# Patient Record
Sex: Female | Born: 2000 | Race: White | Hispanic: No | Marital: Single | Smoking: Never smoker
Health system: Southern US, Community
[De-identification: ages and names within clinical notes are randomized; demographics above are authoritative.]

---
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
## 2019-09-30 ENCOUNTER — Ambulatory Visit (INDEPENDENT_AMBULATORY_CARE_PROVIDER_SITE_OTHER): Payer: Managed Care, Other (non HMO)

## 2019-09-30 ENCOUNTER — Other Ambulatory Visit: Payer: Self-pay

## 2019-09-30 ENCOUNTER — Ambulatory Visit (INDEPENDENT_AMBULATORY_CARE_PROVIDER_SITE_OTHER): Payer: Managed Care, Other (non HMO) | Admitting: Sports Medicine

## 2019-09-30 DIAGNOSIS — S83002S Unspecified subluxation of left patella, sequela: Secondary | ICD-10-CM | POA: Diagnosis not present

## 2019-09-30 MED ORDER — TRIAZOLAM 0.25 MG PO TABS: ORAL_TABLET | ORAL | 0 refills | Status: DC

## 2019-09-30 NOTE — Assessment & Plan Note (Signed)
History of bilateral, left worse than right patellar dislocations. She is post what sounds to be lateral release and Fulkerson slide on the left. This was 2 years ago and she continues to have significant discomfort, mechanical symptoms. X-rays today, MRI hopefully tomorrow morning at 8 AM. I had like to see her back after the MRI and we will likely consider an injection and additional physical therapy. I would also like her to touch base again with Dr. Griffin Basil. Triazolam for preprocedural anxiolysis.

## 2019-09-30 NOTE — Progress Notes (Signed)
Subjective:    CC: Left knee pain  HPI: This is a pleasant 18 year old female, she is post patellar subluxation/dislocation, she had operative intervention with Dr. Everardo Pacific, it sounds as though she likely had a lateral release, medial plication and Fulkerson slide.  Since then she is had severe pain over the anterior lateral patella, anytime she bends the knee.  She did have some physical therapy after surgery but unfortunately symptoms have persisted.  She did not contact Dr. Everardo Pacific for further evaluation.  I reviewed the past medical history, family history, social history, surgical history, and allergies today and no changes were needed.  Please see the problem list section below in epic for further details.  Past Medical History: No past medical history on file. Past Surgical History: No past surgical history on file. Social History: Social History   Socioeconomic History  . Marital status: Single    Spouse name: Not on file  . Number of children: Not on file  . Years of education: Not on file  . Highest education level: Not on file  Occupational History  . Not on file  Tobacco Use  . Smoking status: Never Smoker  . Smokeless tobacco: Never Used  Substance and Sexual Activity  . Alcohol use: No  . Drug use: Not on file  . Sexual activity: Not on file  Other Topics Concern  . Not on file  Social History Narrative  . Not on file   Social Determinants of Health   Financial Resource Strain:   . Difficulty of Paying Living Expenses: Not on file  Food Insecurity:   . Worried About Programme researcher, broadcasting/film/video in the Last Year: Not on file  . Ran Out of Food in the Last Year: Not on file  Transportation Needs:   . Lack of Transportation (Medical): Not on file  . Lack of Transportation (Non-Medical): Not on file  Physical Activity:   . Days of Exercise per Week: Not on file  . Minutes of Exercise per Session: Not on file  Stress:   . Feeling of Stress : Not on file  Social  Connections:   . Frequency of Communication with Friends and Family: Not on file  . Frequency of Social Gatherings with Friends and Family: Not on file  . Attends Religious Services: Not on file  . Active Member of Clubs or Organizations: Not on file  . Attends Banker Meetings: Not on file  . Marital Status: Not on file   Family History: No family history on file. Allergies: No Known Allergies Medications: See med rec.  Review of Systems: No fevers, chills, night sweats, weight loss, chest pain, or shortness of breath.   Objective:    General: Well Developed, well nourished, and in no acute distress.  Neuro: Alert and oriented x3, extra-ocular muscles intact, sensation grossly intact.  HEENT: Normocephalic, atraumatic, pupils equal round reactive to light, neck supple, no masses, no lymphadenopathy, thyroid nonpalpable.  Skin: Warm and dry, no rashes. Cardiac: Regular rate and rhythm, no murmurs rubs or gallops, no lower extremity edema.  Respiratory: Clear to auscultation bilaterally. Not using accessory muscles, speaking in full sentences. Left knee: Normal to inspection with no erythema or effusion or obvious bony abnormalities. Well-healed surgical scars. Palpation normal with no warmth or joint line tenderness or patellar tenderness or condyle tenderness. ROM normal in flexion and extension and lower leg rotation. Ligaments with solid consistent endpoints including ACL, PCL, LCL, MCL. Negative Mcmurray's and provocative meniscal tests. Positive  painful patellar compression, positive patellar apprehension sign. Patellar and quadriceps tendons unremarkable. Hamstring and quadriceps strength is normal.  Impression and Recommendations:    Patellar subluxation History of bilateral, left worse than right patellar dislocations. She is post what sounds to be lateral release and Fulkerson slide on the left. This was 2 years ago and she continues to have significant  discomfort, mechanical symptoms. X-rays today, MRI hopefully tomorrow morning at 8 AM. I had like to see her back after the MRI and we will likely consider an injection and additional physical therapy. I would also like her to touch base again with Dr. Griffin Basil. Triazolam for preprocedural anxiolysis.   ___________________________________________ Gwen Her. Dianah Field, M.D., ABFM., CAQSM. Primary Care and Sports Medicine [facility]  Adjunct Professor of [place] [employer] of Medicine

## 2019-10-01 ENCOUNTER — Ambulatory Visit (INDEPENDENT_AMBULATORY_CARE_PROVIDER_SITE_OTHER): Payer: Managed Care, Other (non HMO) | Admitting: Sports Medicine

## 2019-10-01 ENCOUNTER — Encounter: Payer: Self-pay | Admitting: Sports Medicine

## 2019-10-01 ENCOUNTER — Ambulatory Visit (INDEPENDENT_AMBULATORY_CARE_PROVIDER_SITE_OTHER): Payer: Managed Care, Other (non HMO)

## 2019-10-01 DIAGNOSIS — S83002S Unspecified subluxation of left patella, sequela: Secondary | ICD-10-CM

## 2019-10-01 NOTE — Assessment & Plan Note (Signed)
Bilateral patellar dislocations, she is post lateral release on the left, she did have some persistent pain, mostly patellofemoral in nature. MRI showed patellofemoral chondromalacia, pain was predominantly when sitting, squatting, and with knees bent. She did not respond to oral analgesics so today we injected her knee, I would like her to also touch base with Dr. Griffin Basil to see if anything else needs to be done. She will continue her patellofemoral rehabilitation.

## 2019-10-01 NOTE — Progress Notes (Signed)
Subjective:    CC: Follow-up  HPI: This is a pleasant 18 year old female, she is post lateral release on her knee, she had a patellar dislocation.  Continues to have pain anteriorly, worse when getting up from a seated position moderate, persistent, localized without radiation, no mechanical symptoms, no trauma.  I reviewed the past medical history, family history, social history, surgical history, and allergies today and no changes were needed.  Please see the problem list section below in epic for further details.  Past Medical History: No past medical history on file. Past Surgical History: No past surgical history on file. Social History: Social History   Socioeconomic History  . Marital status: Single    Spouse name: Not on file  . Number of children: Not on file  . Years of education: Not on file  . Highest education level: Not on file  Occupational History  . Not on file  Tobacco Use  . Smoking status: Never Smoker  . Smokeless tobacco: Never Used  Substance and Sexual Activity  . Alcohol use: No  . Drug use: Not on file  . Sexual activity: Not on file  Other Topics Concern  . Not on file  Social History Narrative  . Not on file   Social Determinants of Health   Financial Resource Strain:   . Difficulty of Paying Living Expenses: Not on file  Food Insecurity:   . Worried About Charity fundraiser in the Last Year: Not on file  . Ran Out of Food in the Last Year: Not on file  Transportation Needs:   . Lack of Transportation (Medical): Not on file  . Lack of Transportation (Non-Medical): Not on file  Physical Activity:   . Days of Exercise per Week: Not on file  . Minutes of Exercise per Session: Not on file  Stress:   . Feeling of Stress : Not on file  Social Connections:   . Frequency of Communication with Friends and Family: Not on file  . Frequency of Social Gatherings with Friends and Family: Not on file  . Attends Religious Services: Not on file  .  Active Member of Clubs or Organizations: Not on file  . Attends Archivist Meetings: Not on file  . Marital Status: Not on file   Family History: No family history on file. Allergies: No Known Allergies Medications: See med rec.  Review of Systems: No fevers, chills, night sweats, weight loss, chest pain, or shortness of breath.   Objective:    General: Well Developed, well nourished, and in no acute distress.  Neuro: Alert and oriented x3, extra-ocular muscles intact, sensation grossly intact.  HEENT: Normocephalic, atraumatic, pupils equal round reactive to light, neck supple, no masses, no lymphadenopathy, thyroid nonpalpable.  Skin: Warm and dry, no rashes. Cardiac: Regular rate and rhythm, no murmurs rubs or gallops, no lower extremity edema.  Respiratory: Clear to auscultation bilaterally. Not using accessory muscles, speaking in full sentences.  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  Procedure: Real-time Ultrasound Guided injection of the left knee Device: Samsung HS60  Verbal informed consent obtained.  Time-out conducted.  Noted no overlying erythema, induration, or other signs of local infection.  Skin prepped in a sterile fashion.  Local anesthesia: Topical Ethyl chloride.  With sterile technique and under real time ultrasound guidance: 1 cc Kenalog 40, 2 cc lidocaine, 2 cc bupivacaine injected easily Completed without difficulty  Pain immediately resolved suggesting accurate  placement of the medication.  Advised to call if fevers/chills, erythema, induration, drainage, or persistent bleeding.  Images permanently stored and available for review in the ultrasound unit.  Impression: Technically successful ultrasound guided injection.  Impression and Recommendations:    Patellar subluxation Bilateral patellar dislocations, she is post lateral release on the left, she  did have some persistent pain, mostly patellofemoral in nature. MRI showed patellofemoral chondromalacia, pain was predominantly when sitting, squatting, and with knees bent. She did not respond to oral analgesics so today we injected her knee, I would like her to also touch base with Dr. Everardo Pacific to see if anything else needs to be done. She will continue her patellofemoral rehabilitation.   ___________________________________________ Ihor Austin. Benjamin Stain, M.D., ABFM., CAQSM. Primary Care and Sports Medicine [facility]  Adjunct Professor of Family Medicine  [employer]

## 2019-10-29 ENCOUNTER — Ambulatory Visit (INDEPENDENT_AMBULATORY_CARE_PROVIDER_SITE_OTHER): Payer: Managed Care, Other (non HMO) | Admitting: Sports Medicine

## 2019-10-29 ENCOUNTER — Other Ambulatory Visit: Payer: Self-pay

## 2019-10-29 DIAGNOSIS — S83002S Unspecified subluxation of left patella, sequela: Secondary | ICD-10-CM

## 2019-10-29 NOTE — Progress Notes (Signed)
    Procedures performed today:    None.  Independent interpretation of tests performed by another provider:   None.  Impression and Recommendations:    Patellar subluxation This pleasant 19 year old female returns, she has had bilateral patellar dislocations, and recurrent subluxations, she is post lateral release on the left. Unfortunately she continues to have persistent pain, MRI ultimately showed significant patellofemoral chondromalacia, with symptoms consistent with this finding. At the last visit I injected her knee and she is a lot better now. We also had her touch base with Dr. Everardo Pacific who had recommended continued nonoperative intervention. She can do Tylenol as needed, rehab exercises, she will also look into viscosupplementation, I am happy to do this bilaterally if needed. Return as needed.    ___________________________________________ Ihor Austin. Benjamin Stain, M.D., ABFM., CAQSM. Primary Care and Sports Medicine  [facility]  Adjunct Instructor of Family Medicine  [employer]

## 2019-10-29 NOTE — Assessment & Plan Note (Signed)
This pleasant 19 year old female returns, she has had bilateral patellar dislocations, and recurrent subluxations, she is post lateral release on the left. Unfortunately she continues to have persistent pain, MRI ultimately showed significant patellofemoral chondromalacia, with symptoms consistent with this finding. At the last visit I injected her knee and she is a lot better now. We also had her touch base with Dr. Everardo Pacific who had recommended continued nonoperative intervention. She can do Tylenol as needed, rehab exercises, she will also look into viscosupplementation, I am happy to do this bilaterally if needed. Return as needed.

## 2020-04-02 ENCOUNTER — Ambulatory Visit (INDEPENDENT_AMBULATORY_CARE_PROVIDER_SITE_OTHER): Payer: Managed Care, Other (non HMO) | Admitting: Physical Therapy

## 2020-04-02 ENCOUNTER — Other Ambulatory Visit: Payer: Self-pay

## 2020-04-02 ENCOUNTER — Encounter: Payer: Self-pay | Admitting: Physical Therapy

## 2020-04-02 DIAGNOSIS — M25661 Stiffness of right knee, not elsewhere classified: Secondary | ICD-10-CM

## 2020-04-02 DIAGNOSIS — M6281 Muscle weakness (generalized): Secondary | ICD-10-CM

## 2020-04-02 DIAGNOSIS — R600 Localized edema: Secondary | ICD-10-CM | POA: Diagnosis not present

## 2020-04-02 NOTE — Therapy (Signed)
[facility] [place] [address] [place], [place], [address] Phone: [phone]   Fax:  [phone]  Physical Therapy Evaluation  Patient Details  Name: Kendra Christensen MRN: [id number] Date of Birth: 09/12/2001 Referring Provider (PT): Ramond Marrow MD   Encounter Date: 04/02/2020   PT End of Session - 04/02/20 1148    Visit Number 1    Number of Visits 12    Date for PT Re-Evaluation 05/14/20    Authorization Type Cigna    PT Start Time 1149    PT Stop Time 1233    PT Time Calculation (min) 44 min    Equipment Utilized During Treatment Right knee immobilizer    Activity Tolerance Patient tolerated treatment well    Behavior During Therapy [facility] for tasks assessed/performed           History reviewed. No pertinent past medical history.  History reviewed. No pertinent surgical history.  There were no vitals filed for this visit.    Subjective Assessment - 04/02/20 1149    Subjective Patient had Rt arthroscopic chondroplasty, MPFL reconstruction with semitendinosus allograft, lateral release and tibial tubercle ant medialization 03/23/20. This surgery was proactive to prevent disclocation. She had the same surgery on the left knee in 2018. Patient is NWB in knee immobilizer and uses bil crutches. She reports that she scoots up/down stairs on her bottom due to not trusting her left knee. She denies pain.    Pertinent History Lt MPFL reconstruction 09/18/17    Limitations Walking    Patient Stated Goals Get back to full motion; be able to run eventually    Currently in Pain? No/denies              [facility] PT Assessment - 04/02/20 0001      Assessment   Referring Provider (PT) Ramond Marrow MD    Onset Date/Surgical Date 03/23/20    Next MD Visit August    Prior Therapy yes      Precautions   Precautions Knee    Precaution Comments brace locked in ext until 04/06/20, then unlocked at all times except sleeping.        Restrictions   Weight Bearing Restrictions Yes    RLE Weight Bearing Touchdown weight bearing    Other Position/Activity Restrictions heel touch 20% with brace locked in ext  for 1st 2 weeks, then unlocked brace.      Balance Screen   Has the patient fallen in the past 6 months No    Has the patient had a decrease in activity level because of a fear of falling?  No    Is the patient reluctant to leave their home because of a fear of falling?  No      Home Nurse, mental health Private residence    Living Arrangements Parent    Additional Comments stairs; uses seated technique due to left knee issues      Prior Function   Level of Independence Independent    Warden/ranger    Vocation Requirements leaves for [place] Aug 12th    Leisure running      Observation/Other Assessments   Focus on Therapeutic Outcomes (FOTO)  74% limited      Observation/Other Assessments-Edema    Edema Circumferential      Circumferential Edema   Circumferential - Right 49cm    Circumferential - Left  48 cm      ROM / Strength   AROM / PROM /  Strength PROM;AROM      AROM   AROM Assessment Site Knee    Right/Left Knee Right    Right Knee Flexion 30      PROM   PROM Assessment Site Knee    Right/Left Knee Right    Right Knee Extension 0    Right Knee Flexion 30      Palpation   Patella mobility WFL    Palpation comment tender around incisions      Ambulation/Gait   Pre-Gait Activities we used scale to determine 20% WB for pt    Gait Comments NWB on bil crutches WNL                      Objective measurements completed on examination: See above findings.               PT Education - 04/02/20 1254    Education Details HEP; heel touch WB at 20#    Person(s) Educated Patient    Methods Explanation;Demonstration;Handout    Comprehension Verbalized understanding;Returned demonstration            PT Short Term Goals - 04/02/20 1305      PT SHORT TERM  GOAL #1   Title Ind with initial HEP    Time 2    Period Weeks    Status New    Target Date 04/16/20             PT Long Term Goals - 04/02/20 1306      PT LONG TERM GOAL #1   Title Pt to demo right knee flexion = or > than left knee flex.    Time 6    Period Weeks    Status New      PT LONG TERM GOAL #2   Title Patient ind with HEP within protocol limits for strenghening.    Time 6    Period Weeks    Status New      PT LONG TERM GOAL #3   Title Patient able to safely ascend/descend curb and stairs in clinic using Bil crutches.    Time 6    Period Weeks    Status New                  Plan - 04/02/20 1254    Clinical Impression Statement Patient presents s/p right knee surgery (arthroscopic chondroplasty, MPFL reconstruction with semitendinosus allograft, lateral release and tibial tubercle ant medialization) on  03/23/20. She presents NWB on bil crutches in a Rt. knee immobilizer. She denies pain at this time. She has limited knee flexion to 30 deg and full knee ext. Incisions still have Steri strips and are healing well. She has edema in the right knee and reports regular icing at home. She has stairs at home, but scoots on her bottom up/down due to lack of confidence in her left knee which has also had the same surgery. She will benefit from PT to gain full ROM and strength and return to her PLOF. She returns to school in [place] on 05/14/20 and will continue PT there. Goals here are set within protocol limits.    Examination-Activity Limitations Transfers;Locomotion Level;Stairs    Examination-Participation Restrictions School;Driving    Stability/Clinical Decision Making Stable/Uncomplicated    Clinical Decision Making Low    Rehab Potential Excellent    PT Frequency 2x / week    PT Duration 6 weeks    PT Treatment/Interventions ADLs/Self Care Home Management;Electrical  Stimulation;Cryotherapy;Neuromuscular re-education;Therapeutic exercise;Gait training;Stair  training;Patient/family education;Manual techniques;Passive range of motion;Vasopneumatic Device;Taping    PT Next Visit Plan PROM to tolerance; progress active knee flex as tol, but avoid active knee extension; Strength: gentle quad sets, co-contractions, isometric quad/HS in extension and at knee flex > 60 deg.    PT Home Exercise Plan [id number] (quad sets and prone AA knee flex)    Consulted and Agree with Plan of Care Patient           Patient will benefit from skilled therapeutic intervention in order to improve the following deficits and impairments:  Difficulty walking, Decreased range of motion, Decreased strength, Increased edema  Visit Diagnosis: Stiffness of right knee, not elsewhere classified - Plan: PT plan of care cert/re-cert  Muscle weakness (generalized) - Plan: PT plan of care cert/re-cert  Localized edema - Plan: PT plan of care cert/re-cert     Problem List Patient Active Problem List   Diagnosis Date Noted  . Patellar subluxation 09/12/2016    Solon Palm PT 04/02/2020, 1:14 PM  [facility] [address] [place], [place], [address] Phone: [phone]   Fax:  [phone]  Name: Melina Mosteller MRN: [id number] Date of Birth: 02-13-2001

## 2020-04-02 NOTE — Patient Instructions (Signed)
Access Code: [email or web address] URL: [email or web address] Date: 04/02/2020 Prepared by: Raynelle Fanning  Exercises Supine Quadricep Sets - 3 x daily - 7 x weekly - 3 sets - 15 reps - 5 second hold Prone Knee Flexion AAROM - 2 x daily - 7 x weekly - 3 sets - 10 reps

## 2020-04-07 ENCOUNTER — Ambulatory Visit (INDEPENDENT_AMBULATORY_CARE_PROVIDER_SITE_OTHER): Payer: Managed Care, Other (non HMO) | Admitting: Rehabilitative and Restorative Service Providers"

## 2020-04-07 ENCOUNTER — Encounter: Payer: Self-pay | Admitting: Rehabilitative and Restorative Service Providers"

## 2020-04-07 ENCOUNTER — Other Ambulatory Visit: Payer: Self-pay

## 2020-04-07 DIAGNOSIS — M6281 Muscle weakness (generalized): Secondary | ICD-10-CM

## 2020-04-07 DIAGNOSIS — M25661 Stiffness of right knee, not elsewhere classified: Secondary | ICD-10-CM

## 2020-04-07 DIAGNOSIS — R600 Localized edema: Secondary | ICD-10-CM
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
## 2020-04-09 ENCOUNTER — Encounter: Payer: Managed Care, Other (non HMO) | Admitting: Physical Therapy

## 2020-04-13 ENCOUNTER — Encounter: Payer: Managed Care, Other (non HMO) | Admitting: Physical Therapy

## 2020-04-15 ENCOUNTER — Encounter: Payer: Managed Care, Other (non HMO) | Admitting: Physical Therapy

## 2020-04-20 ENCOUNTER — Encounter: Payer: Managed Care, Other (non HMO) | Admitting: Physical Therapy

## 2020-04-23 ENCOUNTER — Encounter: Payer: Managed Care, Other (non HMO) | Admitting: Physical Therapy

## 2020-04-27 ENCOUNTER — Encounter: Payer: Managed Care, Other (non HMO) | Admitting: Physical Therapy

## 2020-04-30 ENCOUNTER — Encounter: Payer: Managed Care, Other (non HMO) | Admitting: Physical Therapy

## 2020-05-04 ENCOUNTER — Encounter: Payer: Managed Care, Other (non HMO) | Admitting: Rehabilitative and Restorative Service Providers"

## 2020-05-07 ENCOUNTER — Encounter: Payer: Managed Care, Other (non HMO) | Admitting: Physical Therapy

## 2020-05-11 ENCOUNTER — Encounter: Payer: Managed Care, Other (non HMO) | Admitting: Physical Therapy

## 2020-05-13 ENCOUNTER — Encounter: Payer: Managed Care, Other (non HMO) | Admitting: Physical Therapy

## 2020-09-04 IMAGING — DX DG KNEE COMPLETE 4+V*L*
4 series · 4 of 4 positions shown · non-contrast
Comparison: 08/10/2017

CLINICAL DATA: Stabbing pain in left anterolateral knee since
surgery 2 years ago. Surgery for subluxation of patella.

EXAM:
LEFT KNEE - COMPLETE 4+ VIEW

[tunnel]
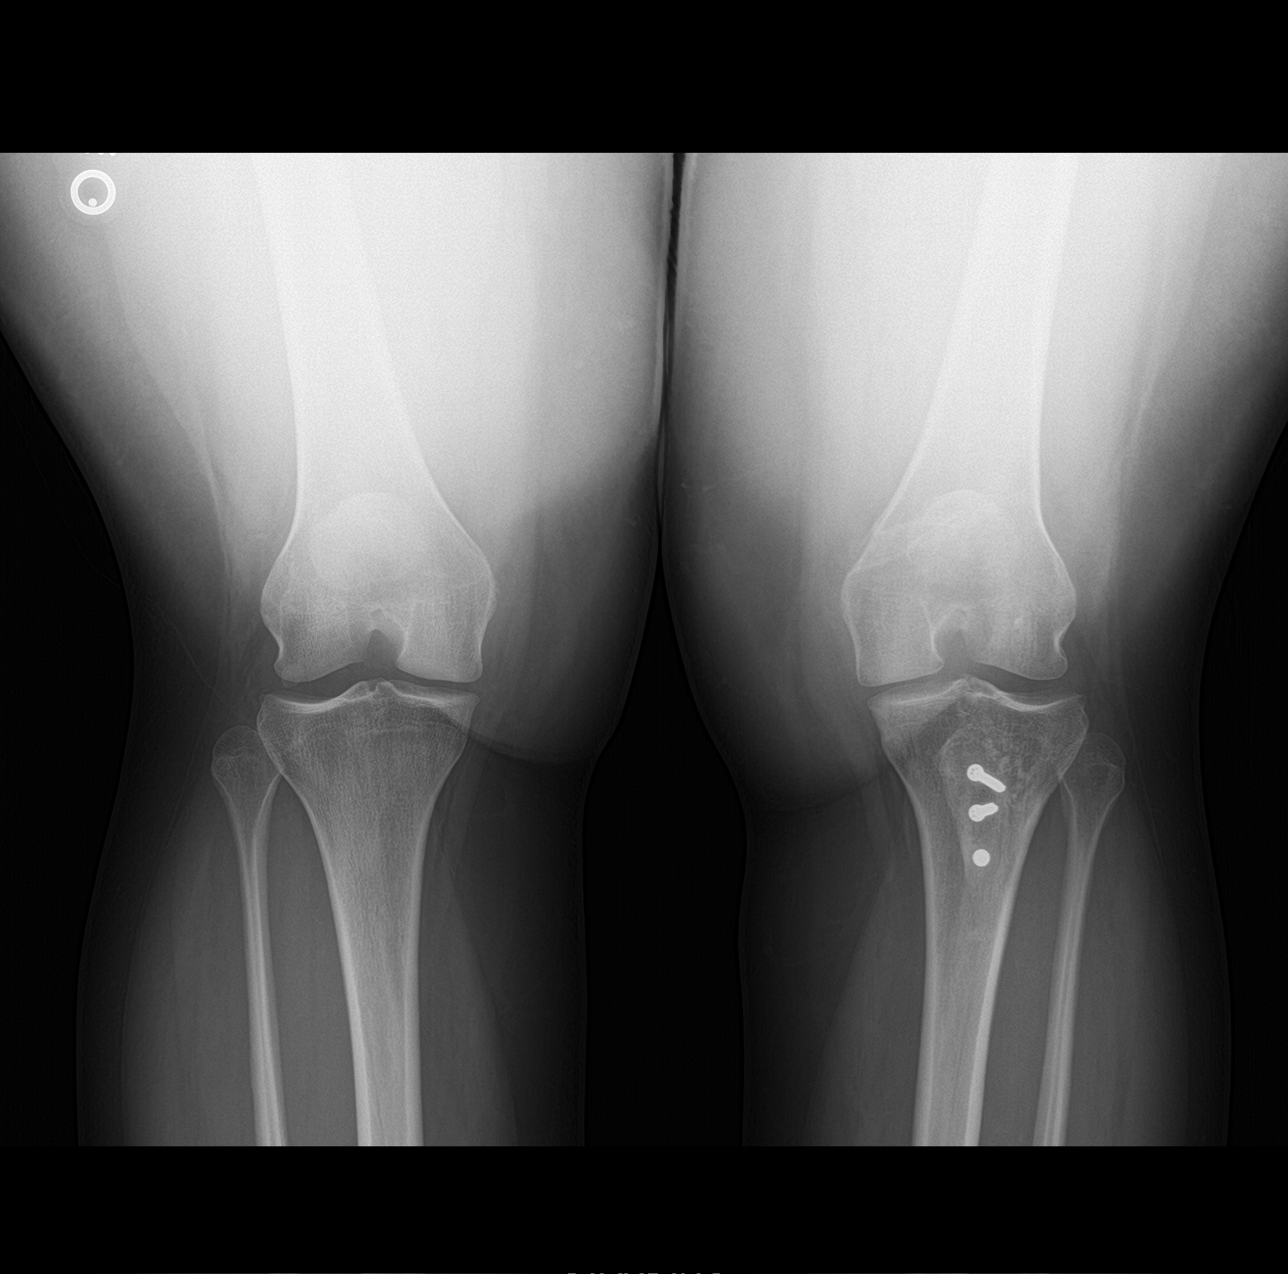

[knee lat]
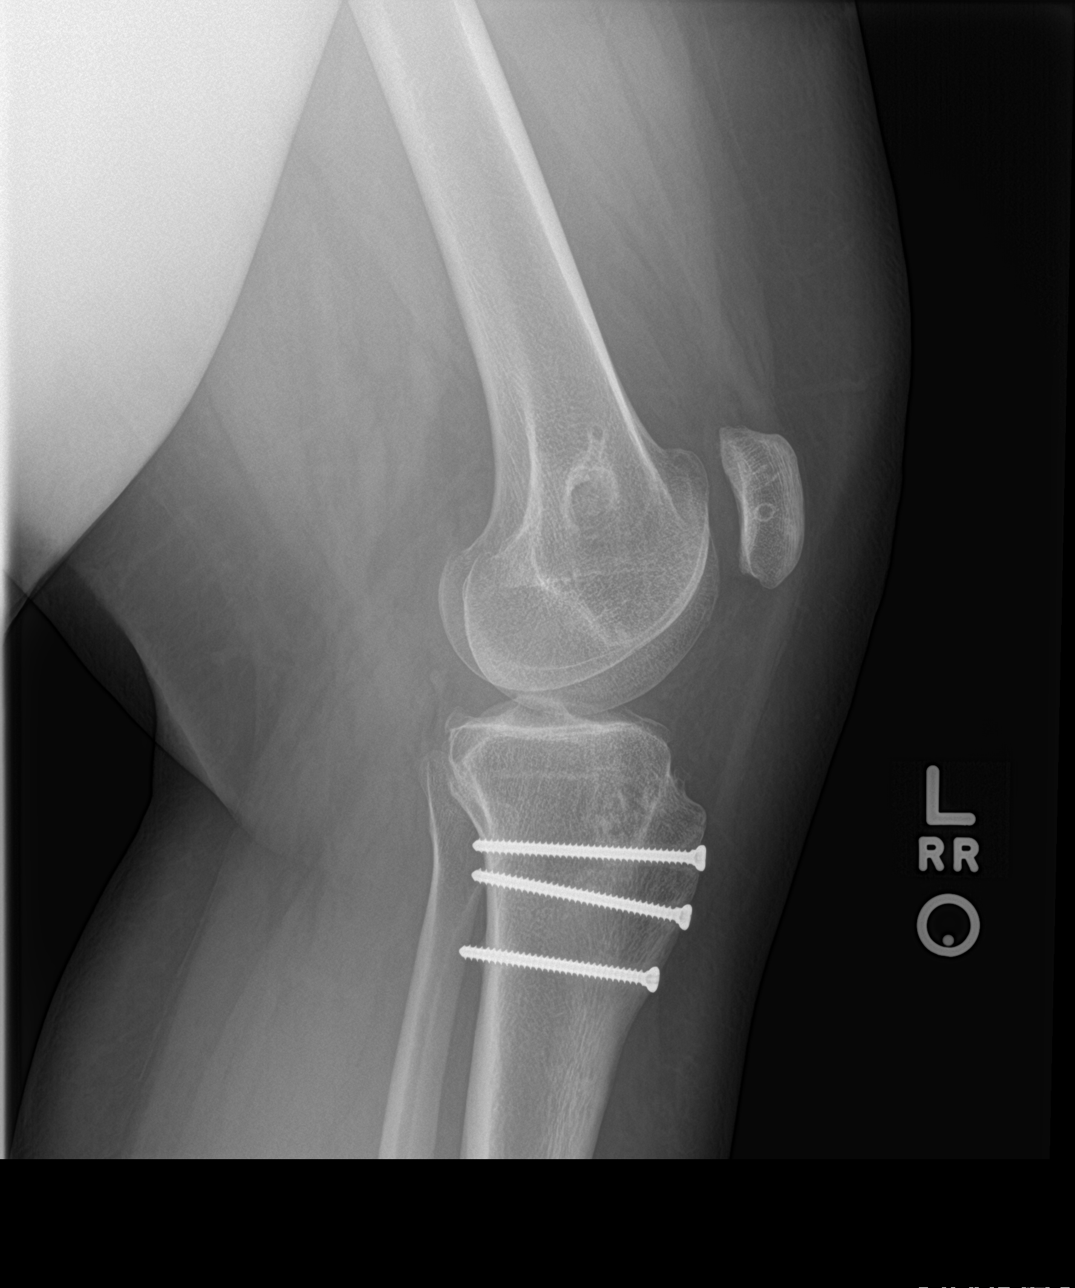

[knee sunrise]
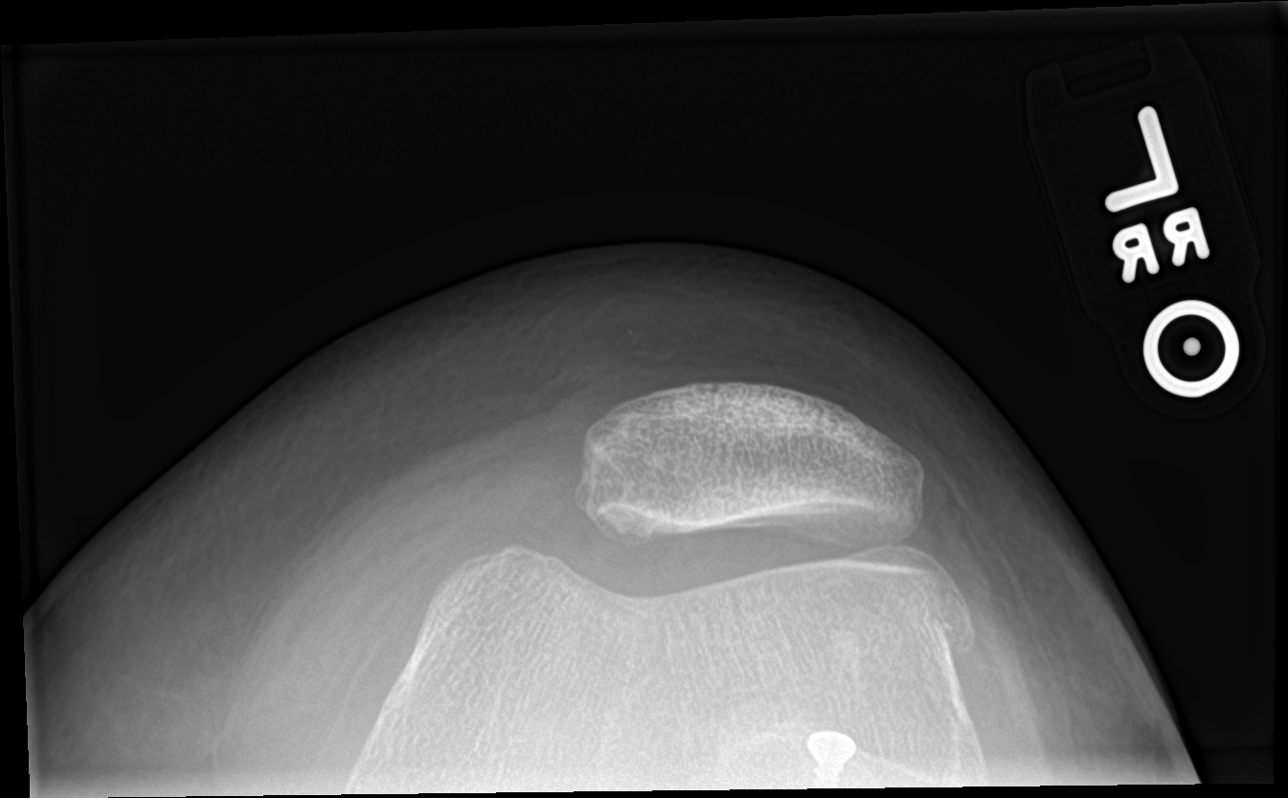

[knee ap bilat standing]
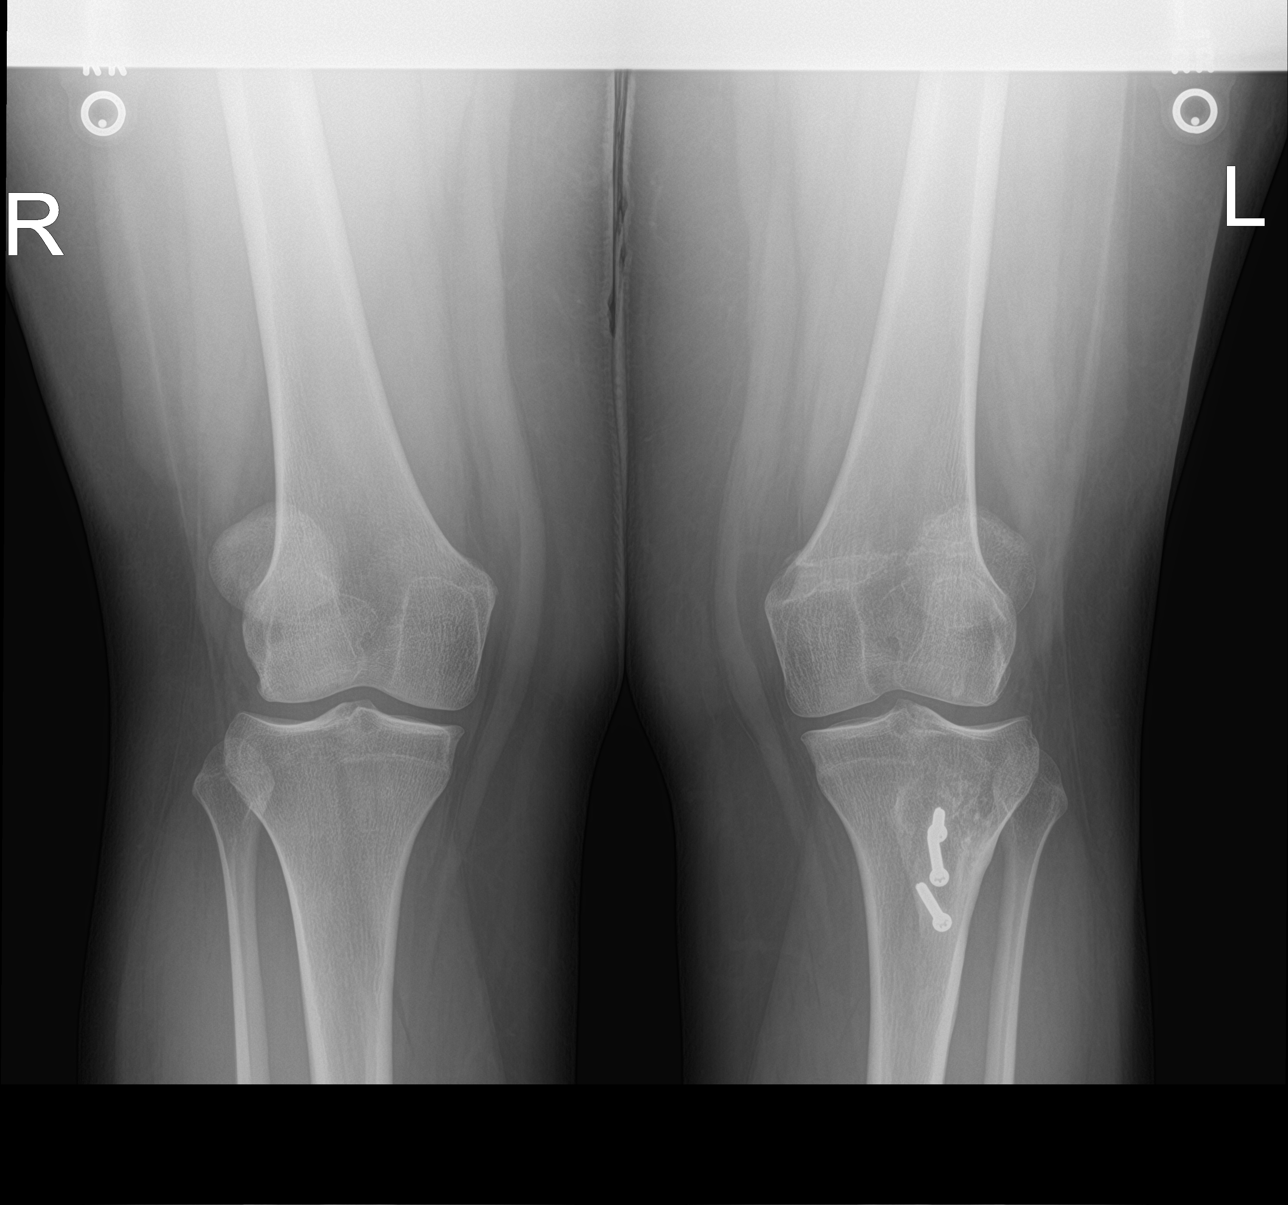

[4 of 4 positions shown; findings below may reference images not displayed]

FINDINGS: Surgical changes about the proximal anterior tibia. No acute
fracture or dislocation. No knee joint effusion. Hardware in the
left patella and distal femur has been removed. Joint spaces
maintained.
IMPRESSION: Postsurgical changes about than left knee, without acute osseous
abnormality.

## 2020-09-04 IMAGING — DX DG KNEE 1-2V*R*
2 series · 2 of 2 positions shown · non-contrast
Comparison: 08/20/2017

CLINICAL DATA: Left-sided knee pain after patellar surgery. No
right-sided symptoms.

EXAM:
RIGHT KNEE - 1-2 VIEW

[tunnel]
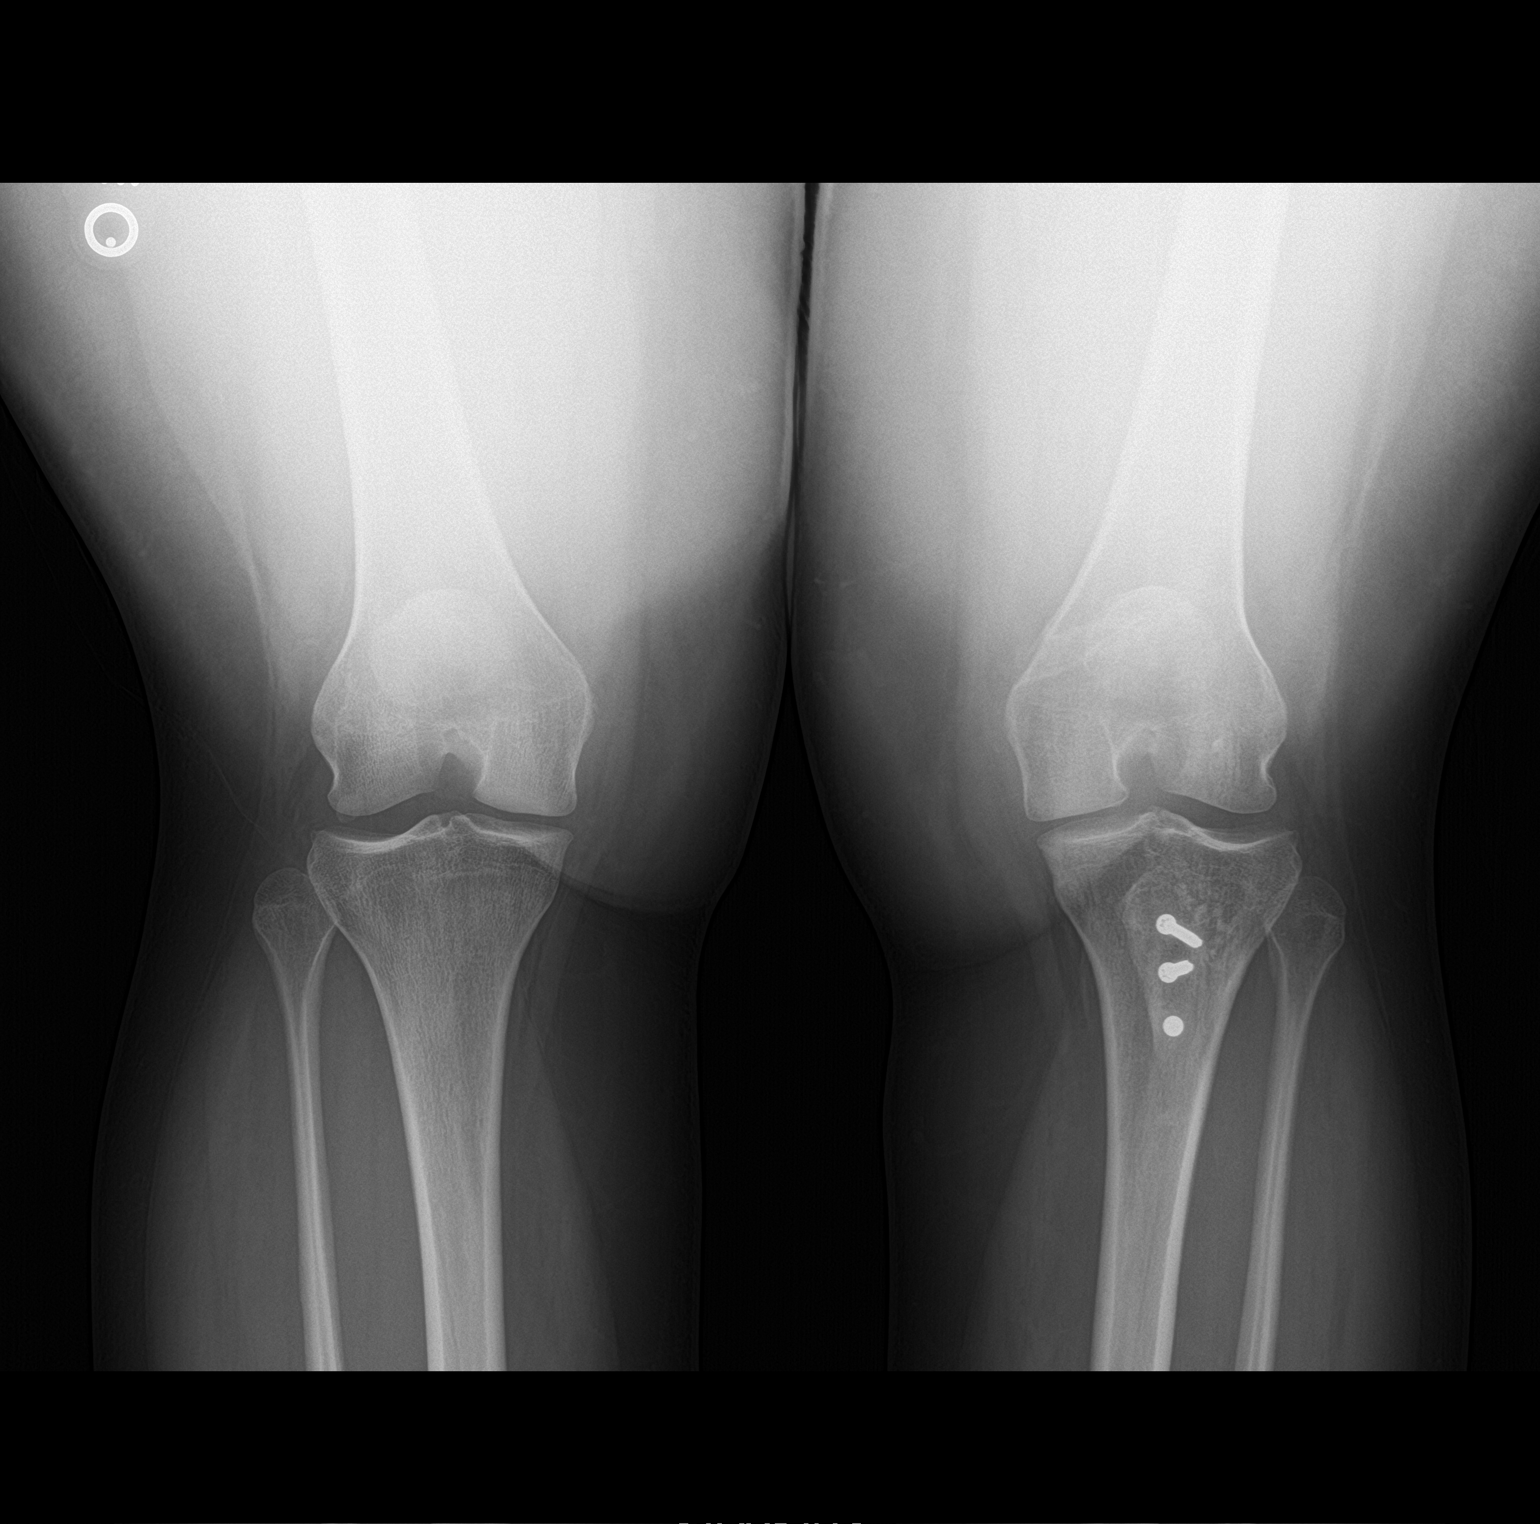

[knee ap bilat standing]
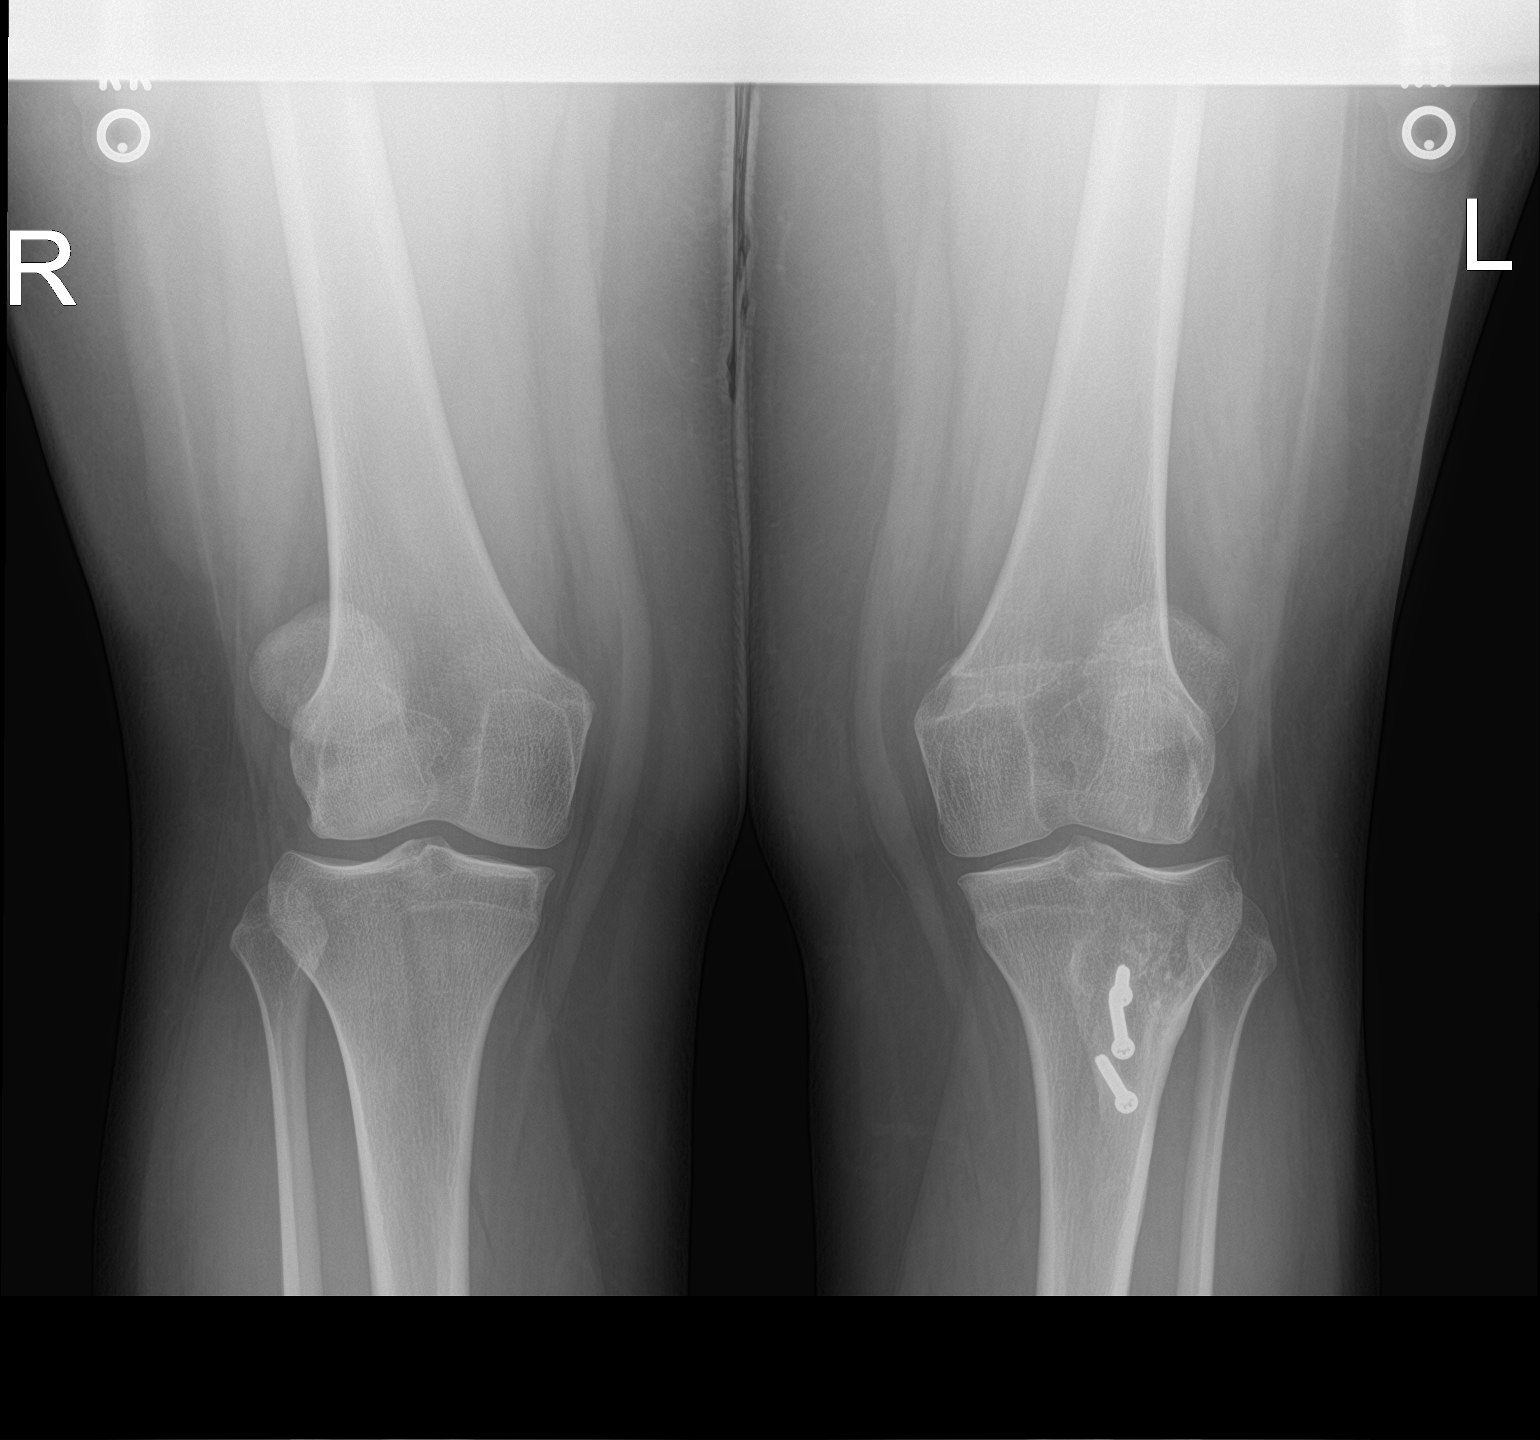

[2 of 2 positions shown; findings below may reference images not displayed]

FINDINGS: AP views of the right knee demonstrate no fracture or dislocation.
Joint spaces maintained.
IMPRESSION: No acute osseous abnormality.

## 2023-05-24 ENCOUNTER — Encounter: Payer: Managed Care, Other (non HMO) | Admitting: Sports Medicine

## 2023-05-29 ENCOUNTER — Ambulatory Visit (INDEPENDENT_AMBULATORY_CARE_PROVIDER_SITE_OTHER): Payer: Managed Care, Other (non HMO)

## 2023-05-29 ENCOUNTER — Ambulatory Visit (INDEPENDENT_AMBULATORY_CARE_PROVIDER_SITE_OTHER): Payer: Managed Care, Other (non HMO) | Admitting: Sports Medicine

## 2023-05-29 DIAGNOSIS — M25572 Pain in left ankle and joints of left foot: Secondary | ICD-10-CM | POA: Diagnosis not present

## 2023-05-29 DIAGNOSIS — G8929 Other chronic pain: Secondary | ICD-10-CM

## 2023-05-29 NOTE — Progress Notes (Signed)
    Procedures performed today:    None.  Independent interpretation of notes and tests performed by another provider:   None.  Brief History, Exam, Impression, and Recommendations:    Chronic pain of left ankle This is a very pleasant 22 year old female, she works at [employer] in [place], and tends to do a lot of walking.  She tried multiple shoes but unfortunately continues to have pain that she localizes at the sinus tarsi laterally. She does have custom molded orthotics made by her podiatrist. She has seen a podiatrist several times over the past few months in [place], she has had injections near the sinus tarsi, she had x-rays a few months ago that were unrevealing, she has never had physical therapy and never had an MRI. On exam she has tenderness at the sinus tarsi, she walks with a fair degree of oversupination. At this point considering failure of conservative treatment we will proceed with MRI as I do think we need to make an of definitive diagnosis. I am going to add some lateral wedges but she will hold off on putting these in her orthotics until we see the MRI. Unfortunately she has to go back to [place] in a few days.    ____________________________________________ Ihor Austin. Benjamin Stain, M.D., ABFM., CAQSM., AME. Primary Care and Sports Medicine [facility]  Adjunct Professor of Family Medicine  [place] [employer]  Restaurant manager, fast food

## 2023-05-29 NOTE — Assessment & Plan Note (Signed)
This is a very pleasant 22 year old female, she works at [employer] in [place], and tends to do a lot of walking.  She tried multiple shoes but unfortunately continues to have pain that she localizes at the sinus tarsi laterally. She does have custom molded orthotics made by her podiatrist. She has seen a podiatrist several times over the past few months in [place], she has had injections near the sinus tarsi, she had x-rays a few months ago that were unrevealing, she has never had physical therapy and never had an MRI. On exam she has tenderness at the sinus tarsi, she walks with a fair degree of oversupination. At this point considering failure of conservative treatment we will proceed with MRI as I do think we need to make an of definitive diagnosis. I am going to add some lateral wedges but she will hold off on putting these in her orthotics until we see the MRI. Unfortunately she has to go back to [place] in a few days.

## 2023-05-31 ENCOUNTER — Encounter: Payer: Self-pay | Admitting: Sports Medicine

## 2023-10-23 ENCOUNTER — Other Ambulatory Visit: Payer: Managed Care, Other (non HMO)

## 2024-06-04 ENCOUNTER — Encounter: Payer: Self-pay | Admitting: Sports Medicine
